# Patient Record
Sex: Female | Born: 1990 | Race: Black or African American | Hispanic: No | Marital: Single | Smoking: Current some day smoker
Health system: Southern US, Community
[De-identification: ages and names within clinical notes are randomized; demographics above are authoritative.]

## PROBLEM LIST (undated history)

## (undated) DIAGNOSIS — J45909 Unspecified asthma, uncomplicated: Secondary | ICD-10-CM

## (undated) HISTORY — DX: Unspecified asthma, uncomplicated: J45.909

---
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
## 2011-10-23 ENCOUNTER — Ambulatory Visit (INDEPENDENT_AMBULATORY_CARE_PROVIDER_SITE_OTHER): Payer: BLUE CROSS/BLUE SHIELD | Admitting: Family Medicine

## 2011-10-23 ENCOUNTER — Other Ambulatory Visit (HOSPITAL_COMMUNITY)
Admission: RE | Admit: 2011-10-23 | Discharge: 2011-10-23 | Disposition: A | Discharge status: Home / Self Care | Payer: BC Managed Care – PPO | Source: Ambulatory Visit | Attending: Family Medicine | Admitting: Family Medicine

## 2011-10-23 ENCOUNTER — Encounter: Payer: Self-pay | Admitting: Family Medicine

## 2011-10-23 VITALS — BP 128/84 | HR 73 | Ht 61.0 in | Wt 145.0 lb

## 2011-10-23 DIAGNOSIS — Z0000 Encounter for general adult medical examination without abnormal findings: Secondary | ICD-10-CM | POA: Insufficient documentation

## 2011-10-23 DIAGNOSIS — IMO0001 Reserved for inherently not codable concepts without codable children: Secondary | ICD-10-CM | POA: Insufficient documentation

## 2011-10-23 DIAGNOSIS — Z113 Encounter for screening for infections with a predominantly sexual mode of transmission: Secondary | ICD-10-CM | POA: Insufficient documentation

## 2011-10-23 DIAGNOSIS — L680 Hirsutism: Secondary | ICD-10-CM

## 2011-10-23 DIAGNOSIS — Z309 Encounter for contraceptive management, unspecified: Secondary | ICD-10-CM

## 2011-10-23 DIAGNOSIS — E663 Overweight: Secondary | ICD-10-CM

## 2011-10-23 LAB — RPR

## 2011-10-23 NOTE — Assessment & Plan Note (Addendum)
Requesting STD testing. HIV, RPR, GC/Chlamydia. History of negative tests. On Implanon Will refer to Rosalita Chessman for nutrition counseling. She is overweight but motivated to do better exercise/diet.

## 2011-10-23 NOTE — Progress Notes (Signed)
  Subjective:    Patient ID: Leah Townsend, female    DOB: Mar 17, 1991, 21 y.o.   MRN: [id number]  HPI She is here for well visit. 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  # Requesting STD testing She is sexually active with one partner past 2 months. Monogamous relationship. Sex with men only.  Birth control:    Implanon placed 09/2009   Uses condoms intermittently ROS: denies vaginal discharge, she has occasional spotting, denies irritation, denies fevers/chills/nausea  # Overweight She does not exercise much. She knows she has to but difficult doing She does not eat a balanced nutritious diet. Hard to motivated herself  Review of Systems Per HPI Denies back pain Denies dysuria    Objective:   Physical Exam GEN: NAD; well-nourished, -appearing SKIN: facial acne, hirsutism CV: RRR, no m/r/g PULM: NI WOB; CTAB without w/r/r ABD: soft, NT, ND, NABS EXT: no edema    Assessment & Plan:

## 2011-10-23 NOTE — Patient Instructions (Addendum)
If your lab results are normal, I will send you a letter with the results. If abnormal, someone at the clinic will get in touch with you.   Tetanus shot today.  You should be hearing from the Health Coach Arlys John). If you don't hear from her in 1 week, please call our clinic and let me know.   Follow-up in 1 year.

## 2011-10-23 NOTE — Addendum Note (Signed)
Addended by: Madolyn Frieze, Marylene Land J on: 10/23/2011 11:24 AM   Modules accepted: Orders

## 2011-10-27 ENCOUNTER — Encounter: Payer: Self-pay | Admitting: Family Medicine

## 2011-11-20 NOTE — Progress Notes (Signed)
Left message to call back if interested in working with a health coach.

## 2012-09-26 ENCOUNTER — Ambulatory Visit (INDEPENDENT_AMBULATORY_CARE_PROVIDER_SITE_OTHER): Payer: BC Managed Care – PPO | Admitting: Family Medicine

## 2012-09-26 ENCOUNTER — Other Ambulatory Visit (HOSPITAL_COMMUNITY)
Admission: RE | Admit: 2012-09-26 | Discharge: 2012-09-26 | Disposition: A | Discharge status: Home / Self Care | Payer: BC Managed Care – PPO | Source: Ambulatory Visit | Attending: Family Medicine | Admitting: Family Medicine

## 2012-09-26 ENCOUNTER — Encounter: Payer: Self-pay | Admitting: Family Medicine

## 2012-09-26 VITALS — BP 130/93 | HR 70 | Temp 98.2°F | Ht 61.0 in | Wt 136.0 lb

## 2012-09-26 DIAGNOSIS — Z30017 Encounter for initial prescription of implantable subdermal contraceptive: Secondary | ICD-10-CM

## 2012-09-26 DIAGNOSIS — Z113 Encounter for screening for infections with a predominantly sexual mode of transmission: Secondary | ICD-10-CM | POA: Insufficient documentation

## 2012-09-26 DIAGNOSIS — Z309 Encounter for contraceptive management, unspecified: Secondary | ICD-10-CM

## 2012-09-26 DIAGNOSIS — IMO0001 Reserved for inherently not codable concepts without codable children: Secondary | ICD-10-CM

## 2012-09-26 DIAGNOSIS — Z0000 Encounter for general adult medical examination without abnormal findings: Secondary | ICD-10-CM

## 2012-09-26 DIAGNOSIS — Z3046 Encounter for surveillance of implantable subdermal contraceptive: Secondary | ICD-10-CM

## 2012-09-26 MED ORDER — ETONOGESTREL 68 MG ~~LOC~~ IMPL
68.0000 mg | DRUG_IMPLANT | Freq: Once | SUBCUTANEOUS | Status: AC
  Administered 2012-09-26: 68 mg via SUBCUTANEOUS

## 2012-09-26 NOTE — Patient Instructions (Addendum)
Leave bandage on until later tonight. Ice the area as much as tolerated, giving breaks in between, to help with bruising and swelling.  Can take bandage off before bed, leave steri strips on until tomorrow morning, they will loosen and come off in shower.  We will call if STD testing results are abnormal and send a letter if they are normal.   Call [facility] for scheduling a nutrition appointment.

## 2012-09-26 NOTE — Progress Notes (Signed)
  Subjective:    Patient ID: Leah Townsend, female    DOB: 24-Jun-1990, 22 y.o.   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  #Implanon Expired 09/25/2012 Wishes to have it replaced Had concerns about birth control causing infertility later in life, reassured her that this is incorrect  #Nutrionist referral Has concerns about diet habits    Review of Systems negative except as above     Objective:   Physical Exam Filed Vitals:   09/26/12 1426  BP: 130/93  Pulse: 70  Temp: 98.2 F (36.8 C)   General appearance: alert and no distress Head: Normocephalic, without obvious abnormality, atraumatic Lungs: clear to auscultation bilaterally Heart: regular rate and rhythm and S1, S2 normal Abdomen: soft, non-tender; bowel sounds normal; no masses,  no organomegaly Pelvic: cervix normal in appearance, external genitalia normal, no adnexal masses or tenderness, no cervical motion tenderness, uterus normal size, shape, and consistency and vagina normal without discharge Skin: mild acne on face, hirsutim      Assessment & Plan:  See problem list

## 2012-09-26 NOTE — Assessment & Plan Note (Signed)
Nexplanon replaced in office today on 09/26/2012.

## 2012-09-26 NOTE — Assessment & Plan Note (Signed)
Requested STD testing today. HIV, RPR, GC/Chlamydia done. History of negative tests. Replaced nexplanon today. Requests nutritional counseling, referral put in and patient told to schedule.

## 2012-09-27 LAB — RPR

## 2012-09-27 LAB — HIV ANTIBODY (ROUTINE TESTING W REFLEX): HIV: NONREACTIVE

## 2013-01-26 ENCOUNTER — Other Ambulatory Visit: Payer: Self-pay

## 2014-01-03 ENCOUNTER — Ambulatory Visit: Payer: BC Managed Care – PPO | Admitting: Family Medicine

## 2014-01-18 ENCOUNTER — Encounter (HOSPITAL_COMMUNITY): Payer: Self-pay | Admitting: Emergency Medicine

## 2014-01-18 ENCOUNTER — Emergency Department (HOSPITAL_COMMUNITY)
Admission: EM | Admit: 2014-01-18 | Discharge: 2014-01-18 | Disposition: A | Discharge status: Home / Self Care | Payer: No Typology Code available for payment source | Attending: Emergency Medicine | Admitting: Emergency Medicine

## 2014-01-18 DIAGNOSIS — S3992XA Unspecified injury of lower back, initial encounter: Secondary | ICD-10-CM | POA: Insufficient documentation

## 2014-01-18 DIAGNOSIS — Z720 Tobacco use: Secondary | ICD-10-CM | POA: Insufficient documentation

## 2014-01-18 DIAGNOSIS — Y9389 Activity, other specified: Secondary | ICD-10-CM | POA: Insufficient documentation

## 2014-01-18 DIAGNOSIS — S199XXA Unspecified injury of neck, initial encounter: Secondary | ICD-10-CM | POA: Insufficient documentation

## 2014-01-18 DIAGNOSIS — Y92410 Unspecified street and highway as the place of occurrence of the external cause: Secondary | ICD-10-CM | POA: Diagnosis not present

## 2014-01-18 DIAGNOSIS — J45909 Unspecified asthma, uncomplicated: Secondary | ICD-10-CM | POA: Insufficient documentation

## 2014-01-18 MED ORDER — KETOROLAC TROMETHAMINE 60 MG/2ML IM SOLN
60.0000 mg | Freq: Once | INTRAMUSCULAR | Status: AC
  Administered 2014-01-18: 60 mg via INTRAMUSCULAR
  Filled 2014-01-18: qty 2

## 2014-01-18 MED ORDER — CYCLOBENZAPRINE HCL 10 MG PO TABS: 10.0000 mg | ORAL_TABLET | Freq: Two times a day (BID) | ORAL | Status: DC | PRN

## 2014-01-18 MED ORDER — IBUPROFEN 600 MG PO TABS: 600.0000 mg | ORAL_TABLET | Freq: Four times a day (QID) | ORAL | Status: DC | PRN

## 2014-01-18 MED ORDER — METHOCARBAMOL 500 MG PO TABS
500.0000 mg | ORAL_TABLET | ORAL | Status: AC
  Administered 2014-01-18: 500 mg via ORAL
  Filled 2014-01-18: qty 1

## 2014-01-18 NOTE — ED Notes (Signed)
Declined W/C at D/C and was escorted to lobby by RN. 
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
## 2014-01-18 NOTE — Discharge Instructions (Signed)
Please follow the directions provided. Be sure to follow-up with your primary care provider to ensure you're getting better. Take the medicine as directed for pain and muscle spasms. Don't hesitate to return for new, worsening, or concerning symptoms.  SEEK IMMEDIATE MEDICAL CARE IF:  You have pain that radiates from your back into your legs.  You develop new bowel or bladder control problems.  You have unusual weakness or numbness in your arms or legs.  You develop nausea or vomiting.  You develop abdominal pain.  You feel faint.
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
## 2014-01-30 ENCOUNTER — Ambulatory Visit: Payer: BC Managed Care – PPO | Admitting: Family Medicine

## 2014-05-25 ENCOUNTER — Ambulatory Visit: Payer: Self-pay | Admitting: Family Medicine

## 2014-08-02 ENCOUNTER — Ambulatory Visit: Payer: Self-pay

## 2014-08-08 ENCOUNTER — Other Ambulatory Visit (HOSPITAL_COMMUNITY)
Admission: RE | Admit: 2014-08-08 | Discharge: 2014-08-08 | Disposition: A | Discharge status: Home / Self Care | Payer: BLUE CROSS/BLUE SHIELD | Source: Ambulatory Visit | Attending: Family Medicine | Admitting: Family Medicine

## 2014-08-08 ENCOUNTER — Ambulatory Visit (INDEPENDENT_AMBULATORY_CARE_PROVIDER_SITE_OTHER): Payer: BLUE CROSS/BLUE SHIELD | Admitting: Family Medicine

## 2014-08-08 VITALS — BP 121/61 | HR 76 | Temp 97.8°F | Ht 62.0 in | Wt 126.5 lb

## 2014-08-08 DIAGNOSIS — Z0000 Encounter for general adult medical examination without abnormal findings: Secondary | ICD-10-CM

## 2014-08-08 DIAGNOSIS — Z113 Encounter for screening for infections with a predominantly sexual mode of transmission: Secondary | ICD-10-CM | POA: Diagnosis not present

## 2014-08-08 DIAGNOSIS — J069 Acute upper respiratory infection, unspecified: Secondary | ICD-10-CM | POA: Diagnosis not present

## 2014-08-08 DIAGNOSIS — Z202 Contact with and (suspected) exposure to infections with a predominantly sexual mode of transmission: Secondary | ICD-10-CM | POA: Diagnosis not present

## 2014-08-08 LAB — POCT URINE PREGNANCY: Preg Test, Ur: NEGATIVE

## 2014-08-08 NOTE — Patient Instructions (Signed)
Upper Respiratory Infection, Adult An upper respiratory infection (URI) is also sometimes known as the common cold. The upper respiratory tract includes the nose, sinuses, throat, trachea, and bronchi. Bronchi are the airways leading to the lungs. Most people improve within 1 week, but symptoms can last up to 2 weeks. A residual cough may last even longer.  CAUSES Many different viruses can infect the tissues lining the upper respiratory tract. The tissues become irritated and inflamed and often become very moist. Mucus production is also common. A cold is contagious. You can easily spread the virus to others by oral contact. This includes kissing, sharing a glass, coughing, or sneezing. Touching your mouth or nose and then touching a surface, which is then touched by another person, can also spread the virus. SYMPTOMS  Symptoms typically develop 1 to 3 days after you come in contact with a cold virus. Symptoms vary from person to person. They may include:  Runny nose.  Sneezing.  Nasal congestion.  Sinus irritation.  Sore throat.  Loss of voice (laryngitis).  Cough.  Fatigue.  Muscle aches.  Loss of appetite.  Headache.  Low-grade fever. DIAGNOSIS  You might diagnose your own cold based on familiar symptoms, since most people get a cold 2 to 3 times a year. Your caregiver can confirm this based on your exam. Most importantly, your caregiver can check that your symptoms are not due to another disease such as strep throat, sinusitis, pneumonia, asthma, or epiglottitis. Blood tests, throat tests, and X-rays are not necessary to diagnose a common cold, but they may sometimes be helpful in excluding other more serious diseases. Your caregiver will decide if any further tests are required. RISKS AND COMPLICATIONS  You may be at risk for a more severe case of the common cold if you smoke cigarettes, have chronic heart disease (such as heart failure) or lung disease (such as asthma), or if  you have a weakened immune system. The very young and very old are also at risk for more serious infections. Bacterial sinusitis, middle ear infections, and bacterial pneumonia can complicate the common cold. The common cold can worsen asthma and chronic obstructive pulmonary disease (COPD). Sometimes, these complications can require emergency medical care and may be life-threatening. PREVENTION  The best way to protect against getting a cold is to practice good hygiene. Avoid oral or hand contact with people with cold symptoms. Wash your hands often if contact occurs. There is no clear evidence that vitamin C, vitamin E, echinacea, or exercise reduces the chance of developing a cold. However, it is always recommended to get plenty of rest and practice good nutrition. TREATMENT  Treatment is directed at relieving symptoms. There is no cure. Antibiotics are not effective, because the infection is caused by a virus, not by bacteria. Treatment may include:  Increased fluid intake. Sports drinks offer valuable electrolytes, sugars, and fluids.  Breathing heated mist or steam (vaporizer or shower).  Eating chicken soup or other clear broths, and maintaining good nutrition.  Getting plenty of rest.  Using gargles or lozenges for comfort.  Controlling fevers with ibuprofen or acetaminophen as directed by your caregiver.  Increasing usage of your inhaler if you have asthma. Zinc gel and zinc lozenges, taken in the first 24 hours of the common cold, can shorten the duration and lessen the severity of symptoms. Pain medicines may help with fever, muscle aches, and throat pain. A variety of non-prescription medicines are available to treat congestion and runny nose. Your caregiver   can make recommendations and may suggest nasal or lung inhalers for other symptoms.  HOME CARE INSTRUCTIONS   Only take over-the-counter or prescription medicines for pain, discomfort, or fever as directed by your  caregiver.  Use a warm mist humidifier or inhale steam from a shower to increase air moisture. This may keep secretions moist and make it easier to breathe.  Drink enough water and fluids to keep your urine clear or pale yellow.  Rest as needed.  Return to work when your temperature has returned to normal or as your caregiver advises. You may need to stay home longer to avoid infecting others. You can also use a face mask and careful hand washing to prevent spread of the virus. SEEK MEDICAL CARE IF:   After the first few days, you feel you are getting worse rather than better.  You need your caregiver's advice about medicines to control symptoms.  You develop chills, worsening shortness of breath, or brown or red sputum. These may be signs of pneumonia.  You develop yellow or brown nasal discharge or pain in the face, especially when you bend forward. These may be signs of sinusitis.  You develop a fever, swollen neck glands, pain with swallowing, or white areas in the back of your throat. These may be signs of strep throat. SEEK IMMEDIATE MEDICAL CARE IF:   You have a fever.  You develop severe or persistent headache, ear pain, sinus pain, or chest pain.  You develop wheezing, a prolonged cough, cough up blood, or have a change in your usual mucus (if you have chronic lung disease).  You develop sore muscles or a stiff neck. Document Released: 09/02/2000 Document Revised: 06/01/2011 Document Reviewed: 06/14/2013 Exit[employer] Patient Information 2015 [employer]. This information is not intended to replace advice given to you by your health care provider. Make sure you discuss any questions you have with your health care provider.  

## 2014-08-09 LAB — RPR

## 2014-08-09 LAB — HIV ANTIBODY (ROUTINE TESTING W REFLEX): HIV 1&2 Ab, 4th Generation: NONREACTIVE

## 2014-08-10 LAB — URINE CYTOLOGY ANCILLARY ONLY
Chlamydia: NEGATIVE
NEISSERIA GONORRHEA: NEGATIVE

## 2014-08-11 DIAGNOSIS — J069 Acute upper respiratory infection, unspecified: Secondary | ICD-10-CM | POA: Insufficient documentation

## 2014-08-11 NOTE — Progress Notes (Signed)
Subjective:     Patient ID: Leah SpiesAyana J Townsend, female   DOB: 1991-03-19, 24 y.o.   MRN: [id number]  HPI Mrs. Leah Townsend is a 24yo presenting for upper respiratory tract infection and would also like STD screening.  #URI: - First noted symptoms on Saturday 5/14. States she caught it from her boss who was out of work for a few days. - Complains of runny and stuffy nose, congestion - Symptoms worse when she gets up in the morning - Denies cough, body aches, fevers, facial pain - Has not taken any medications at this time - Reports trouble working due to above symptoms. Requests note for today and tomorrow out of work to return to work 5/20  # STD Screening - Denies history of previous STD - Last sexually active in March. Did not use protection. - Implanon in place - Denies discharge, irritation, bleeding, dysuria, genital lesions  Review of Systems  Constitutional: Negative for fever.  HENT: Positive for congestion, postnasal drip and rhinorrhea. Negative for sinus pressure.   Genitourinary: Negative for dysuria, urgency, frequency, hematuria, vaginal discharge and vaginal pain.       Objective:   Physical Exam  Constitutional: She is oriented to person, place, and time. She appears well-developed and well-nourished.  HENT:  Head: Normocephalic and atraumatic.  Right Ear: External ear normal.  Left Ear: External ear normal.  Post nasal drip noted  Eyes: Conjunctivae are normal. Pupils are equal, round, and reactive to light. Right eye exhibits no discharge.  Cardiovascular: Normal rate and regular rhythm.  Exam reveals no gallop and no friction rub.   No murmur heard. Pulmonary/Chest: Effort normal. No respiratory distress. She has no wheezes. She has no rales.  Abdominal: Soft. She exhibits no distension. There is no tenderness.  Musculoskeletal: She exhibits no edema.  Lymphadenopathy:    She has no cervical adenopathy.  Neurological: She is alert and oriented to person, place, and  time.  Skin: Skin is warm and dry. No rash noted.  Psychiatric: She has a normal mood and affect. Her behavior is normal.      Assessment:     Please refer to Problem List for Assessment.     Plan:     Please refer to Problem List for Plan.

## 2014-08-11 NOTE — Assessment & Plan Note (Signed)
-   STD Screening: HIV nonreactive, RPR nonreactive, GC/Chlamydia negative, Pregnancy negative

## 2014-08-11 NOTE — Assessment & Plan Note (Signed)
-   Will not prescribe antibiotics at this time - Suspect this will improve on it's own - Patient declines other medications at this time - Note written to return to work 5/20

## 2014-08-21 ENCOUNTER — Telehealth: Payer: Self-pay | Admitting: Family Medicine

## 2014-08-21 NOTE — Telephone Encounter (Signed)
Phone number listed not functional. Attempted to contact x3 concerning normal lab results. Requested labs be given via phone instead of via mail. Discuss lab results at next office visit or if she calls requesting results.

## 2014-08-23 ENCOUNTER — Ambulatory Visit (INDEPENDENT_AMBULATORY_CARE_PROVIDER_SITE_OTHER): Payer: BLUE CROSS/BLUE SHIELD | Admitting: Family Medicine

## 2014-08-23 VITALS — BP 120/72 | HR 75 | Temp 97.8°F | Ht 62.0 in | Wt 124.9 lb

## 2014-08-23 DIAGNOSIS — Z3009 Encounter for other general counseling and advice on contraception: Secondary | ICD-10-CM | POA: Diagnosis not present

## 2014-08-23 NOTE — Patient Instructions (Signed)
It was nice seeing you today, we got your Nexplanon out today. Now that your birth control is out, you can become pregnant if sexually active, so please use condom for protection if sexually active.

## 2014-08-23 NOTE — Progress Notes (Signed)
Patient ID: Leah Townsend, female   DOB: [birth year], 24 y.o.   MRN: [id number] Nexplanon Removal Procedure Note  PRE-OP DIAGNOSIS:Desire for  Nexplanon removal. She is not interested in birth control for now.  POST-OP DIAGNOSIS: Same   PROCEDURE: Nexplanon Removal  Performing Physician:  Janit PaganKehinde Janylah Belgrave, MD, MPH  PROCEDURE:  Anesthesia: 2% Lidocaine w/ epinephrine 5 ml  On her right arm.  Nexplanon device palpated prior to starting procedure on her right arm.  Procedure: Consent obtained. A time-out was performed prior to initiating procedure to be sure of right patient and right location. The area surrounding the Nexplanon was prepared with Choloraprep and draped in the usual sterile manner. The site was anesthetized with lidocaine. A perpendicular skin incision was made over the distal aspect of the device. The capsule lysed sharply and the device removed using a hemostat. Hemostasis was assured. The site was dressed witha pressure dressing. The patient tolerated the procedure well.   Followup: The patient tolerated the procedure well without complications. Standard post-procedure care is explained and return precautions are given.Advised to use condom to prevent pregnancy since she is not currently interested in starting another birth control.

## 2015-01-28 ENCOUNTER — Encounter: Payer: Self-pay | Admitting: Family Medicine

## 2015-01-28 ENCOUNTER — Ambulatory Visit (INDEPENDENT_AMBULATORY_CARE_PROVIDER_SITE_OTHER): Payer: BLUE CROSS/BLUE SHIELD | Admitting: Family Medicine

## 2015-01-28 VITALS — BP 116/68 | HR 84 | Temp 98.8°F | Wt 120.0 lb

## 2015-01-28 DIAGNOSIS — J069 Acute upper respiratory infection, unspecified: Secondary | ICD-10-CM | POA: Diagnosis not present

## 2015-01-28 NOTE — Patient Instructions (Signed)
Upper Respiratory Infection, Adult Most upper respiratory infections (URIs) are a viral infection of the air passages leading to the lungs. A URI affects the nose, throat, and upper air passages. The most common type of URI is nasopharyngitis and is typically referred to as "the common cold." URIs run their course and usually go away on their own. Most of the time, a URI does not require medical attention, but sometimes a bacterial infection in the upper airways can follow a viral infection. This is called a secondary infection. Sinus and middle ear infections are common types of secondary upper respiratory infections. Bacterial pneumonia can also complicate a URI. A URI can worsen asthma and chronic obstructive pulmonary disease (COPD). Sometimes, these complications can require emergency medical care and may be life threatening.  CAUSES Almost all URIs are caused by viruses. A virus is a type of germ and can spread from one person to another.  RISKS FACTORS You may be at risk for a URI if:   You smoke.   You have chronic heart or lung disease.  You have a weakened defense (immune) system.   You are very young or very old.   You have nasal allergies or asthma.  You work in crowded or poorly ventilated areas.  You work in health care facilities or schools. SIGNS AND SYMPTOMS  Symptoms typically develop 2-3 days after you come in contact with a cold virus. Most viral URIs last 7-10 days. However, viral URIs from the influenza virus (flu virus) can last 14-18 days and are typically more severe. Symptoms may include:   Runny or stuffy (congested) nose.   Sneezing.   Cough.   Sore throat.   Headache.   Fatigue.   Fever.   Loss of appetite.   Pain in your forehead, behind your eyes, and over your cheekbones (sinus pain).  Muscle aches.  DIAGNOSIS  Your health care provider may diagnose a URI by:  Physical exam.  Tests to check that your symptoms are not due to  another condition such as:  Strep throat.  Sinusitis.  Pneumonia.  Asthma. TREATMENT  A URI goes away on its own with time. It cannot be cured with medicines, but medicines may be prescribed or recommended to relieve symptoms. Medicines may help:  Reduce your fever.  Reduce your cough.  Relieve nasal congestion. HOME CARE INSTRUCTIONS   Take medicines only as directed by your health care provider.   Gargle warm saltwater or take cough drops to comfort your throat as directed by your health care provider.  Use a warm mist humidifier or inhale steam from a shower to increase air moisture. This may make it easier to breathe.  Drink enough fluid to keep your urine clear or pale yellow.   Eat soups and other clear broths and maintain good nutrition.   Rest as needed.   Return to work when your temperature has returned to normal or as your health care provider advises. You may need to stay home longer to avoid infecting others. You can also use a face mask and careful hand washing to prevent spread of the virus.  Increase the usage of your inhaler if you have asthma.   Do not use any tobacco products, including cigarettes, chewing tobacco, or electronic cigarettes. If you need help quitting, ask your health care provider. PREVENTION  The best way to protect yourself from getting a cold is to practice good hygiene.   Avoid oral or hand contact with people with cold   symptoms.   Wash your hands often if contact occurs.  There is no clear evidence that vitamin C, vitamin E, echinacea, or exercise reduces the chance of developing a cold. However, it is always recommended to get plenty of rest, exercise, and practice good nutrition.  SEEK MEDICAL CARE IF:   You are getting worse rather than better.   Your symptoms are not controlled by medicine.   You have chills.  You have worsening shortness of breath.  You have brown or red mucus.  You have yellow or brown nasal  discharge.  You have pain in your face, especially when you bend forward.  You have a fever.  You have swollen neck glands.  You have pain while swallowing.  You have white areas in the back of your throat. SEEK IMMEDIATE MEDICAL CARE IF:   You have severe or persistent:  Headache.  Ear pain.  Sinus pain.  Chest pain.  You have chronic lung disease and any of the following:  Wheezing.  Prolonged cough.  Coughing up blood.  A change in your usual mucus.  You have a stiff neck.  You have changes in your:  Vision.  Hearing.  Thinking.  Mood. MAKE SURE YOU:   Understand these instructions.  Will watch your condition.  Will get help right away if you are not doing well or get worse.   This information is not intended to replace advice given to you by your health care provider. Make sure you discuss any questions you have with your health care provider.   Document Released: 09/02/2000 Document Revised: 07/24/2014 Document Reviewed: 06/14/2013 Elsevier Interactive Patient Education 2016 [employer]  

## 2015-01-28 NOTE — Progress Notes (Signed)
    Subjective: CC: URI, nausea HPI: Patient is a 24 y.o. female presenting to clinic today for same day appt. Concerns today include:  Patient reports that symptoms started last Thursday.  She reports that symptoms are mostly present in am.  She reports some fatigue, runny nose, sore throat.  She has sick contacts (dad, cousin).  One episode of nonbloody diarrhea yesterday.  Patient is eating and drinking normally.  Denies cough, sneezing, itchy eyes, vomiting, fevers.  She has taken Motrin/Tylenol with little relief.  LMP 01/11/15.  Social History Reviewed: non tobacco smoker. occ MJ use FamHx and MedHx updated.  Please see EMR. Health Maintenance: no flu shot, declines  ROS: Per HPI  Objective: Office vital signs reviewed. BP 116/68 mmHg  Pulse 84  Temp(Src) 98.8 F (37.1 C) (Oral)  Wt 120 lb (54.432 kg)  Physical Examination:  General: Awake, alert, well nourished, No acute distress HEENT: Normal    Neck: No masses palpated. No lymphadenopathy    Ears: Tympanic membranes intact, normal light reflex, no erythema, no bulging, moderate cerumen in canals bilaterally    Eyes: PERRLA, EOMI    Nose: nasal turbinates moist    Throat: moist mucus membranes, no erythema, no tonsillar exudate Cardio: regular rate and rhythm, S1S2 heard, no murmurs appreciated Pulm: clear to auscultation bilaterally, no wheezes, rhonchi or rales, normal work of breathing Extremities: warm, well perfused, No edema, cyanosis or clubbing; +2 radial pulses bilaterally Skin: dry, intact, no rashes or lesions  Assessment/ Plan: 24 y.o. female   1. Acute upper respiratory infection, likely viral.  Afebrile at today's visit.  No evidence of secondary bacterial infection, including strep throat or sinus infection.  Does not fit with influenza.  Additionally, no wheeze one exam.  No antibiotics needed today. - Recommended supportive care - Motrin, Tylenol as needed pain/headache or fevers - Work note  provided - Return precautions reviewed - Follow up with Dr Waynetta SandyWight as needed routine care  Raliegh IpAshly M Gottschalk, DO PGY-2, [facility]

## 2019-09-06 ENCOUNTER — Emergency Department (HOSPITAL_COMMUNITY)
Admission: EM | Admit: 2019-09-06 | Discharge: 2019-09-06 | Disposition: A | Discharge status: Home / Self Care | Payer: BLUE CROSS/BLUE SHIELD | Attending: Emergency Medicine | Admitting: Emergency Medicine

## 2019-09-06 ENCOUNTER — Other Ambulatory Visit: Payer: Self-pay

## 2019-09-06 ENCOUNTER — Encounter (HOSPITAL_COMMUNITY): Payer: Self-pay | Admitting: *Deleted

## 2019-09-06 DIAGNOSIS — J45909 Unspecified asthma, uncomplicated: Secondary | ICD-10-CM | POA: Insufficient documentation

## 2019-09-06 DIAGNOSIS — F17210 Nicotine dependence, cigarettes, uncomplicated: Secondary | ICD-10-CM | POA: Insufficient documentation

## 2019-09-06 DIAGNOSIS — Y9389 Activity, other specified: Secondary | ICD-10-CM | POA: Insufficient documentation

## 2019-09-06 DIAGNOSIS — Y92410 Unspecified street and highway as the place of occurrence of the external cause: Secondary | ICD-10-CM | POA: Insufficient documentation

## 2019-09-06 DIAGNOSIS — Y999 Unspecified external cause status: Secondary | ICD-10-CM | POA: Insufficient documentation

## 2019-09-06 DIAGNOSIS — S161XXA Strain of muscle, fascia and tendon at neck level, initial encounter: Secondary | ICD-10-CM | POA: Insufficient documentation

## 2019-09-06 DIAGNOSIS — Z79899 Other long term (current) drug therapy: Secondary | ICD-10-CM | POA: Insufficient documentation

## 2019-09-06 MED ORDER — IBUPROFEN 600 MG PO TABS: 600.0000 mg | ORAL_TABLET | Freq: Four times a day (QID) | ORAL | 0 refills | Status: DC | PRN

## 2019-09-06 MED ORDER — IBUPROFEN 800 MG PO TABS
800.0000 mg | ORAL_TABLET | Freq: Once | ORAL | Status: AC
  Administered 2019-09-06: 800 mg via ORAL
  Filled 2019-09-06: qty 1

## 2019-09-06 MED ORDER — LIDOCAINE 5 % EX PTCH
1.0000 | MEDICATED_PATCH | CUTANEOUS | Status: DC
  Administered 2019-09-06: 1 via TRANSDERMAL
  Filled 2019-09-06: qty 1

## 2019-09-06 MED ORDER — CYCLOBENZAPRINE HCL 10 MG PO TABS: 10.0000 mg | ORAL_TABLET | Freq: Two times a day (BID) | ORAL | 0 refills | Status: DC | PRN

## 2019-09-06 NOTE — ED Notes (Signed)
Patient verbalizes understanding of discharge instructions. Opportunity for questioning and answers were provided. Armband removed by staff, pt discharged from ED.  

## 2019-09-06 NOTE — Discharge Instructions (Addendum)
Home to rest, apply warm compresses to sore muscles for 20 days at a time followed by gentle stretching. You can take Motrin as prescribed as needed, take Flexeril as needed as prescribed.  Do not take Flexeril if driving until you know how this medication affects you.  Flexeril can cause constipation, take Colace if needed. Follow-up with your primary care provider if pain is not improving after 3 to 4 days.
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
## 2020-01-10 ENCOUNTER — Other Ambulatory Visit: Payer: Self-pay

## 2020-01-10 ENCOUNTER — Emergency Department (HOSPITAL_COMMUNITY)
Admission: EM | Admit: 2020-01-10 | Discharge: 2020-01-10 | Disposition: A | Discharge status: Home / Self Care | Payer: Self-pay | Attending: Emergency Medicine | Admitting: Emergency Medicine

## 2020-01-10 ENCOUNTER — Encounter (HOSPITAL_COMMUNITY): Payer: Self-pay | Admitting: Emergency Medicine

## 2020-01-10 DIAGNOSIS — S39012A Strain of muscle, fascia and tendon of lower back, initial encounter: Secondary | ICD-10-CM | POA: Insufficient documentation

## 2020-01-10 DIAGNOSIS — F17200 Nicotine dependence, unspecified, uncomplicated: Secondary | ICD-10-CM | POA: Insufficient documentation

## 2020-01-10 DIAGNOSIS — W07XXXA Fall from chair, initial encounter: Secondary | ICD-10-CM | POA: Insufficient documentation

## 2020-01-10 DIAGNOSIS — J45909 Unspecified asthma, uncomplicated: Secondary | ICD-10-CM | POA: Insufficient documentation

## 2020-01-10 MED ORDER — IBUPROFEN 600 MG PO TABS: 600.0000 mg | ORAL_TABLET | Freq: Four times a day (QID) | ORAL | 0 refills | Status: DC | PRN

## 2020-01-10 MED ORDER — PREDNISONE 10 MG (21) PO TBPK: ORAL_TABLET | ORAL | 0 refills | Status: DC

## 2020-01-10 MED ORDER — CYCLOBENZAPRINE HCL 10 MG PO TABS: 10.0000 mg | ORAL_TABLET | Freq: Two times a day (BID) | ORAL | 0 refills | Status: DC | PRN

## 2020-01-10 NOTE — ED Provider Notes (Signed)
[facility] EMERGENCY DEPARTMENT Provider Note   CSN: [id number] Arrival date & time: 01/10/20  0930     History Chief Complaint  Patient presents with  . Back Pain    Leah Townsend is a 29 y.o. female.  Pt presents to the ED today with left upper back pain.  Pt said it's been hurting for about 2-3 days.  She has been sitting in a chair that has 1 leg broken, so she's been twisting herself to be upright.  She believes this is the cause.  She has been taking ibuprofen without improvement in sx.          Past Medical History:  Diagnosis Date  . ASTHMA, UNSPECIFIED 05/20/2006   Qualifier: Diagnosis of  By: Bradly Bienenstock      Patient Active Problem List   Diagnosis Date Noted  . Acute upper respiratory infection 08/11/2014  . Preventative health care 10/23/2011  . Birth control 10/23/2011  . Hirsutism 09/04/2008    History reviewed. No pertinent surgical history.   OB History   No obstetric history on file.     No family history on file.  Social History   Tobacco Use  . Smoking status: Current Some Day Smoker  . Smokeless tobacco: Never Used  Substance Use Topics  . Alcohol use: Yes    Comment: occas  . Drug use: Yes    Types: Marijuana    Home Medications Prior to Admission medications   Medication Sig Start Date End Date Taking? Authorizing Provider  cyclobenzaprine (FLEXERIL) 10 MG tablet Take 1 tablet (10 mg total) by mouth 2 (two) times daily as needed for muscle spasms. 01/10/20   Jacalyn Lefevre, MD  ibuprofen (ADVIL) 600 MG tablet Take 1 tablet (600 mg total) by mouth every 6 (six) hours as needed. 01/10/20   Jacalyn Lefevre, MD  predniSONE (STERAPRED UNI-PAK 21 TAB) 10 MG (21) TBPK tablet Take 6 tabs for 2 days, then 5 for 2 days, then 4 for 2 days, then 3 for 2 days, 2 for 2 days, then 1 for 2 days 01/10/20   Jacalyn Lefevre, MD    Allergies    Patient has no known allergies.  Review of Systems   Review of Systems    Musculoskeletal: Positive for back pain.  All other systems reviewed and are negative.   Physical Exam Updated Vital Signs BP 124/71 (BP Location: Right Arm)   Pulse 67   Temp 98.5 F (36.9 C) (Oral)   Resp 16   Ht 5\' 1"  (1.549 m)   Wt 55.3 kg   LMP 01/03/2020   SpO2 100%   BMI 23.05 kg/m   Physical Exam Vitals and nursing note reviewed.  Constitutional:      Appearance: Normal appearance.  HENT:     Head: Normocephalic and atraumatic.     Right Ear: External ear normal.     Left Ear: External ear normal.     Nose: Nose normal.     Mouth/Throat:     Mouth: Mucous membranes are moist.     Pharynx: Oropharynx is clear.  Eyes:     Extraocular Movements: Extraocular movements intact.     Conjunctiva/sclera: Conjunctivae normal.     Pupils: Pupils are equal, round, and reactive to light.  Cardiovascular:     Rate and Rhythm: Normal rate and regular rhythm.     Pulses: Normal pulses.     Heart sounds: Normal heart sounds.  Pulmonary:  Effort: Pulmonary effort is normal.     Breath sounds: Normal breath sounds.  Abdominal:     General: Abdomen is flat. Bowel sounds are normal.     Palpations: Abdomen is soft.  Musculoskeletal:     Cervical back: Normal range of motion and neck supple.       Back:  Skin:    General: Skin is warm.     Capillary Refill: Capillary refill takes less than 2 seconds.  Neurological:     General: No focal deficit present.     Mental Status: She is alert.  Psychiatric:        Mood and Affect: Mood normal.        Thought Content: Thought content normal.        Judgment: Judgment normal.     ED Results / Procedures / Treatments   Labs (all labs ordered are listed, but only abnormal results are displayed) Labs Reviewed - No data to display  EKG None  Radiology No results found.  Procedures Procedures (including critical care time)  Medications Ordered in ED Medications - No data to display  ED Course  I have reviewed  the triage vital signs and the nursing notes.  Pertinent labs & imaging results that were available during my care of the patient were reviewed by me and considered in my medical decision making (see chart for details).    MDM Rules/Calculators/A&P                          Pain is muscular.  Pt encouraged to get a new, more ergonomic chair.  She is to return if worse.  Final Clinical Impression(s) / ED Diagnoses Final diagnoses:  Back strain, initial encounter    Rx / DC Orders ED Discharge Orders         Ordered    predniSONE (STERAPRED UNI-PAK 21 TAB) 10 MG (21) TBPK tablet        01/10/20 1006    cyclobenzaprine (FLEXERIL) 10 MG tablet  2 times daily PRN        01/10/20 1006    ibuprofen (ADVIL) 600 MG tablet  Every 6 hours PRN        01/10/20 1006           Jacalyn Lefevre, MD 01/10/20 1008

## 2020-01-10 NOTE — ED Triage Notes (Signed)
C/o L upper back pain x 2-3 days.  Pt states she believes it is from sitting in a broken chair.  Pain improves when she leans forward.

## 2021-08-06 ENCOUNTER — Emergency Department (HOSPITAL_COMMUNITY)
Admission: EM | Admit: 2021-08-06 | Discharge: 2021-08-06 | Disposition: A | Discharge status: Home / Self Care | Payer: Self-pay | Attending: Emergency Medicine | Admitting: Emergency Medicine

## 2021-08-06 DIAGNOSIS — J029 Acute pharyngitis, unspecified: Secondary | ICD-10-CM | POA: Insufficient documentation

## 2021-08-06 DIAGNOSIS — Z20822 Contact with and (suspected) exposure to covid-19: Secondary | ICD-10-CM | POA: Insufficient documentation

## 2021-08-06 DIAGNOSIS — M542 Cervicalgia: Secondary | ICD-10-CM | POA: Insufficient documentation

## 2021-08-06 LAB — GROUP A STREP BY PCR: Group A Strep by PCR: NOT DETECTED

## 2021-08-06 LAB — RESP PANEL BY RT-PCR (FLU A&B, COVID) ARPGX2
Influenza A by PCR: NEGATIVE
Influenza B by PCR: NEGATIVE
SARS Coronavirus 2 by RT PCR: NEGATIVE

## 2021-08-06 NOTE — ED Provider Notes (Signed)
[facility]-EMERGENCY DEPT Provider Note   CSN: [id number] Arrival date & time: 08/06/21  [id number]     History Chief Complaint  Patient presents with   Sore Throat    Leah Townsend is a 31 y.o. female otherwise healthy presents to the ED for evaluation of scratchy throat. She said she reports she had a tickle in her throat that required her to clear her throat. She had some neck pain that was relieved with the Aleve she took earlier. She reports that these pains happened today.  She denies any fever, chills, cough, rhinorrhea, nasal chest, chest pain, shortness of breath, trouble swallowing, trouble breathing. Denies any headache, blurry vision, or any photophobia. Denies any pain on ROM of her neck. She has been eating and drink without difficulty.  Denies any medical history other than hirsutism.  Denies any surgical history.  Negative medications.  No known drug allergies.  Up-to-date on all of her vaccinations as well as child.  She does smoke marijuana daily.  Denies any tobacco or EtOH use.   Sore Throat Pertinent negatives include no chest pain, no abdominal pain, no headaches and no shortness of breath.      Home Medications Prior to Admission medications   Medication Sig Start Date End Date Taking? Authorizing Provider  cyclobenzaprine (FLEXERIL) 10 MG tablet Take 1 tablet (10 mg total) by mouth 2 (two) times daily as needed for muscle spasms. 01/10/20   Jacalyn LefevreHaviland, Julie, MD  ibuprofen (ADVIL) 600 MG tablet Take 1 tablet (600 mg total) by mouth every 6 (six) hours as needed. 01/10/20   Jacalyn LefevreHaviland, Julie, MD  predniSONE (STERAPRED UNI-PAK 21 TAB) 10 MG (21) TBPK tablet Take 6 tabs for 2 days, then 5 for 2 days, then 4 for 2 days, then 3 for 2 days, 2 for 2 days, then 1 for 2 days 01/10/20   Jacalyn LefevreHaviland, Julie, MD      Allergies    Patient has no known allergies.    Review of Systems   Review of Systems  Constitutional:  Negative for chills and fever.  HENT:   Positive for sore throat. Negative for congestion and rhinorrhea.   Eyes:  Negative for photophobia and visual disturbance.  Respiratory:  Negative for shortness of breath.   Cardiovascular:  Negative for chest pain.  Gastrointestinal:  Negative for abdominal pain, constipation, diarrhea, nausea and vomiting.  Neurological:  Negative for headaches.   Physical Exam Updated Vital Signs BP 122/89 (BP Location: Left Arm)   Pulse 87   Temp 98.7 F (37.1 C) (Oral)   Resp 16   Ht 5\' 1"  (1.549 m)   Wt 59 kg   LMP 07/17/2021   SpO2 100%   BMI 24.56 kg/m  Physical Exam Vitals and nursing note reviewed.  Constitutional:      General: She is not in acute distress.    Appearance: She is not ill-appearing or toxic-appearing.     Comments: The patient is well-appearing, laughing, speaking with ease.   HENT:     Head: Normocephalic and atraumatic.     Right Ear: Tympanic membrane, ear canal and external ear normal.     Left Ear: Tympanic membrane, ear canal and external ear normal.     Nose: No congestion or rhinorrhea.     Mouth/Throat:     Mouth: Mucous membranes are moist.     Pharynx: Oropharynx is clear. Uvula midline.     Comments: No pharyngeal erythema, exudate, or edema noted.  Uvula midline.  Airway patent.  Moist mucous membranes. Eyes:     General: No scleral icterus.    Conjunctiva/sclera: Conjunctivae normal.  Neck:     Thyroid: No thyromegaly.     Comments: Full ROM of neck without pain. No cervical, deep or anterior, lymphadenopathy. No masses. No overlying skin changes, other than hirsutism noted to the neck. No midline tenderness. No thyroid nodules or masses palpated on the patient swallowing. No Goiter. No increase in temperature. Neck appears symmetric. Cardiovascular:     Rate and Rhythm: Normal rate.     Heart sounds: Normal heart sounds.  Pulmonary:     Effort: Pulmonary effort is normal. No respiratory distress.     Breath sounds: Normal breath sounds. No  stridor.  Abdominal:     General: Abdomen is flat.     Palpations: Abdomen is soft.  Musculoskeletal:        General: No deformity.     Cervical back: Normal range of motion.  Lymphadenopathy:     Cervical: No cervical adenopathy.  Skin:    Findings: No rash.  Neurological:     General: No focal deficit present.     Mental Status: She is alert.    ED Results / Procedures / Treatments   Labs (all labs ordered are listed, but only abnormal results are displayed) Labs Reviewed  GROUP A STREP BY PCR  RESP PANEL BY RT-PCR (FLU A&B, COVID) [id number]    EKG None  Radiology No results found.  Procedures Procedures   Medications Ordered in ED Medications - No data to display  ED Course/ Medical Decision Making/ A&P                           Medical Decision Making   31 year old female presents the emergency department for evaluation of scratchy throat as well as some neck pain that has not been relieved.  Differential diagnosis includes was not limited to COVID, flu, strep throat, viral illness, pharyngitis, lymphadenopathy, meningitis.  Vital signs are unremarkable.  Patient normotensive, afebrile, normal pulse rate, satting well on room air without any increased work of breathing.  Physical exam is pertinent for well-appearing patient that is laughing and speaking with ease.  TMs are normal.  She has no pharyngeal erythema, edema, or exudate noted.  I do not palpate any cervical lymphadenopathy both anterior or deep.  She has full range of motion of her neck without pain.  No midline tenderness.  I do not palpate any thyroid masses or nodules.  She has no goiter.  No overlying skin changes, erythema, or extra warmth to the area.  We will test for COVID and flu and strep.  COVID, flu, and strep negative.  The patient is well-appearing, she does not have any shortness of breath or trouble swallowing or breathing.  She has a benign physical exam.  I do not suspect any PTA as she  has well-appearing nonerythematous tonsils bilaterally.  Her COVID, flu, and strep are negative.  She does not have any lymphadenopathy.  She has full range of motion of her neck with reassuring vital signs and without a fever, doubt meningitis.  I discussed these results with the patient.  We discussed supportive care measures such as drinking warm liquids with honey and lemon, staying well-hydrated.  Recommended that she can try cough drops to keep her throat well lubricated.  We discussed strict return precautions and red flag symptoms.  The patient  is requesting a note off of work for 2 days.  She verbalizes understanding and agrees to the plan.  Patient is stable being discharged home in good condition.    Final Clinical Impression(s) / ED Diagnoses Final diagnoses:  Sore throat    Rx / DC Orders ED Discharge Orders     None         Achille Rich, PA-C 08/06/21 1238    Bethann Berkshire, MD 08/11/21 [phone]

## 2021-08-06 NOTE — Discharge Instructions (Addendum)
You were seen in the emergency department for evaluation of your scratchy throat.  Your COVID, flu, and strep test were negative.  Please make sure you are keeping your throat well lubricated with fluids, mainly water.  You can also try warm tea with honey and lemon.  You can also try cough drops as well. If you have any trouble swallowing, trouble breathing, fever, neck swelling, chest pain, shortness of breath, please return nearest emergency department for reevaluation.  I have included the information for a primary care office to this discharge paperwork.  Please call to set up an appointment to become established with them. ? ?Contact a doctor if: ?You have a fever for more than 2-3 days. ?You keep having symptoms for more than 2-3 days. ?Your throat does not get better in 7 days. ?You have a fever and your symptoms suddenly get worse. ?Your child who is 3 months to 76 years old has a temperature of 102.2?F (39?C) or higher. ?Get help right away if: ?You have trouble breathing. ?You cannot swallow fluids, soft foods, or your spit. ?You have swelling in your throat or neck that gets worse. ?You feel like you may vomit (nauseous) and this feeling lasts a long time. ?You cannot stop vomiting. ?These symptoms may be an emergency. Get help right away. Call your local emergency services (911 in the [place] ?Do not wait to see if the symptoms will go away. ?Do not drive yourself to the hospital. ?

## 2021-08-06 NOTE — ED Triage Notes (Signed)
Pt states she is having right sided throat pain and swelling. States cold water makes this pain better. ?

## 2022-12-16 ENCOUNTER — Encounter: Payer: Self-pay | Admitting: Family Medicine

## 2022-12-16 ENCOUNTER — Ambulatory Visit (INDEPENDENT_AMBULATORY_CARE_PROVIDER_SITE_OTHER): Payer: Self-pay | Admitting: Family Medicine

## 2022-12-16 VITALS — BP 140/95 | HR 78 | Temp 98.1°F | Resp 16 | Ht 62.0 in | Wt 136.2 lb

## 2022-12-16 DIAGNOSIS — U071 COVID-19: Secondary | ICD-10-CM

## 2022-12-16 DIAGNOSIS — Z7689 Persons encountering health services in other specified circumstances: Secondary | ICD-10-CM

## 2022-12-16 NOTE — Addendum Note (Signed)
Addended by: Kieth Brightly on: 12/16/2022 04:40 PM   Modules accepted: Orders

## 2022-12-16 NOTE — Progress Notes (Signed)
New Patient Office Visit  Subjective    Patient ID: Leah Townsend, female    DOB: [birth year]  Age: 32 y.o. MRN: [id number]  CC:  Chief Complaint  Patient presents with   Nasal Congestion    HPI Leah Townsend presents to establish care and with complaint of positive covid test 2 days ago at home. She is unable to return to work. Denies known contacts.    Outpatient Encounter Medications as of 12/16/2022  Medication Sig   cyclobenzaprine (FLEXERIL) 10 MG tablet Take 1 tablet (10 mg total) by mouth 2 (two) times daily as needed for muscle spasms.   ibuprofen (ADVIL) 600 MG tablet Take 1 tablet (600 mg total) by mouth every 6 (six) hours as needed.   [DISCONTINUED] predniSONE (STERAPRED UNI-PAK 21 TAB) 10 MG (21) TBPK tablet Take 6 tabs for 2 days, then 5 for 2 days, then 4 for 2 days, then 3 for 2 days, 2 for 2 days, then 1 for 2 days   No facility-administered encounter medications on file as of 12/16/2022.    Past Medical History:  Diagnosis Date   ASTHMA, UNSPECIFIED 05/20/2006   Qualifier: Diagnosis of  By: Bradly Bienenstock      History reviewed. No pertinent surgical history.  History reviewed. No pertinent family history.  Social History   Socioeconomic History   Marital status: Single    Spouse name: Not on file   Number of children: Not on file   Years of education: Not on file   Highest education level: Bachelor's degree (e.g., BA, AB, BS)  Occupational History   Not on file  Tobacco Use   Smoking status: Some Days   Smokeless tobacco: Never  Substance and Sexual Activity   Alcohol use: Yes    Comment: occas   Drug use: Yes    Types: Marijuana   Sexual activity: Not on file  Other Topics Concern   Not on file  Social History Narrative   Not on file   Social Determinants of Health   Financial Resource Strain: High Risk (12/15/2022)   Overall Financial Resource Strain (CARDIA)    Difficulty of Paying Living Expenses: Very hard  Food Insecurity:  Food Insecurity Present (12/15/2022)   Hunger Vital Sign    Worried About Running Out of Food in the Last Year: Sometimes true    Ran Out of Food in the Last Year: Patient declined  Transportation Needs: Unmet Transportation Needs (12/15/2022)   PRAPARE - Transportation    Lack of Transportation (Medical): Yes    Lack of Transportation (Non-Medical): Yes  Physical Activity: Sufficiently Active (12/15/2022)   Exercise Vital Sign    Days of Exercise per Week: 2 days    Minutes of Exercise per Session: 150+ min  Stress: Stress Concern Present (12/15/2022)   [employer] of Occupational Health - Occupational Stress Questionnaire    Feeling of Stress : Very much  Social Connections: Socially Isolated (12/15/2022)   Social Connection and Isolation Panel [NHANES]    Frequency of Communication with Friends and Family: Once a week    Frequency of Social Gatherings with Friends and Family: Once a week    Attends Religious Services: Never    Database administrator or Organizations: No    Attends Engineer, structural: Not on file    Marital Status: Never married  Intimate Partner Violence: Not on file    Review of Systems  Constitutional:  Positive for chills and fever.  Respiratory:  Positive for cough. Negative for shortness of breath.   All other systems reviewed and are negative.       Objective    BP (!) 140/95   Pulse 78   Temp 98.1 F (36.7 C) (Oral)   Resp 16   Ht 5\' 2"  (1.575 m)   Wt 136 lb 3.2 oz (61.8 kg)   SpO2 97%   BMI 24.91 kg/m   Physical Exam Vitals and nursing note reviewed.  Constitutional:      General: She is not in acute distress. Cardiovascular:     Rate and Rhythm: Normal rate and regular rhythm.  Pulmonary:     Effort: Pulmonary effort is normal.     Breath sounds: Normal breath sounds.  Abdominal:     Palpations: Abdomen is soft.     Tenderness: There is no abdominal tenderness.  Neurological:     General: No focal deficit present.      Mental Status: She is alert and oriented to person, place, and time.         Assessment & Plan:   1. COVID Results of in office test pending.   2. Encounter to establish care   Return if symptoms worsen or fail to improve.   Tommie Raymond, MD

## 2022-12-18 LAB — COVID-19, FLU A+B AND RSV
Influenza A, NAA: NOT DETECTED
Influenza B, NAA: NOT DETECTED
RSV, NAA: NOT DETECTED
SARS-CoV-2, NAA: NOT DETECTED

## 2023-06-01 ENCOUNTER — Emergency Department (HOSPITAL_COMMUNITY)
Admission: EM | Admit: 2023-06-01 | Discharge: 2023-06-01 | Discharge status: Against Medical Advice | Attending: Emergency Medicine | Admitting: Emergency Medicine

## 2023-06-01 ENCOUNTER — Other Ambulatory Visit: Payer: Self-pay

## 2023-06-01 ENCOUNTER — Encounter (HOSPITAL_COMMUNITY): Payer: Self-pay

## 2023-06-01 DIAGNOSIS — Y92410 Unspecified street and highway as the place of occurrence of the external cause: Secondary | ICD-10-CM | POA: Insufficient documentation

## 2023-06-01 DIAGNOSIS — M545 Low back pain, unspecified: Secondary | ICD-10-CM | POA: Insufficient documentation

## 2023-06-01 DIAGNOSIS — Z5321 Procedure and treatment not carried out due to patient leaving prior to being seen by health care provider: Secondary | ICD-10-CM | POA: Diagnosis not present

## 2023-06-01 DIAGNOSIS — M25552 Pain in left hip: Secondary | ICD-10-CM | POA: Insufficient documentation

## 2023-06-01 NOTE — ED Notes (Signed)
 Pt states that she will be back in the morning.
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
## 2023-06-02 ENCOUNTER — Other Ambulatory Visit: Payer: Self-pay

## 2023-06-02 ENCOUNTER — Emergency Department (HOSPITAL_COMMUNITY)
Admission: EM | Admit: 2023-06-02 | Discharge: 2023-06-02 | Disposition: A | Discharge status: Home / Self Care | Attending: Emergency Medicine | Admitting: Emergency Medicine

## 2023-06-02 DIAGNOSIS — S0990XA Unspecified injury of head, initial encounter: Secondary | ICD-10-CM | POA: Diagnosis present

## 2023-06-02 DIAGNOSIS — Y92410 Unspecified street and highway as the place of occurrence of the external cause: Secondary | ICD-10-CM | POA: Insufficient documentation

## 2023-06-02 DIAGNOSIS — M545 Low back pain, unspecified: Secondary | ICD-10-CM | POA: Insufficient documentation

## 2023-06-02 DIAGNOSIS — M7918 Myalgia, other site: Secondary | ICD-10-CM

## 2023-06-02 DIAGNOSIS — M542 Cervicalgia: Secondary | ICD-10-CM | POA: Insufficient documentation

## 2023-06-02 DIAGNOSIS — R1030 Lower abdominal pain, unspecified: Secondary | ICD-10-CM | POA: Diagnosis not present

## 2023-06-02 MED ORDER — LIDOCAINE 5 % EX PTCH
1.0000 | MEDICATED_PATCH | CUTANEOUS | 0 refills | Status: DC
Start: 2023-06-02 — End: 2023-07-22

## 2023-06-02 MED ORDER — METAXALONE 800 MG PO TABS: 800.0000 mg | ORAL_TABLET | Freq: Three times a day (TID) | ORAL | 0 refills | Status: DC
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
## 2023-07-22 ENCOUNTER — Ambulatory Visit (INDEPENDENT_AMBULATORY_CARE_PROVIDER_SITE_OTHER): Admitting: Family Medicine

## 2023-07-22 ENCOUNTER — Other Ambulatory Visit (HOSPITAL_COMMUNITY)
Admission: RE | Admit: 2023-07-22 | Discharge: 2023-07-22 | Disposition: A | Discharge status: Home / Self Care | Source: Ambulatory Visit | Attending: Family Medicine | Admitting: Family Medicine

## 2023-07-22 VITALS — BP 123/79 | HR 71 | Temp 98.6°F | Resp 16 | Ht 62.0 in | Wt 123.7 lb

## 2023-07-22 DIAGNOSIS — Z0000 Encounter for general adult medical examination without abnormal findings: Secondary | ICD-10-CM | POA: Diagnosis not present

## 2023-07-22 DIAGNOSIS — Z13220 Encounter for screening for lipoid disorders: Secondary | ICD-10-CM | POA: Diagnosis not present

## 2023-07-22 DIAGNOSIS — Z130 Encounter for screening for diseases of the blood and blood-forming organs and certain disorders involving the immune mechanism: Secondary | ICD-10-CM

## 2023-07-22 DIAGNOSIS — Z1159 Encounter for screening for other viral diseases: Secondary | ICD-10-CM | POA: Diagnosis not present

## 2023-07-22 NOTE — Progress Notes (Unsigned)
 Established Patient Office Visit  Subjective    Patient ID: Leah Townsend, female    DOB: December 15, 1990  Age: 33 y.o. MRN: [id number]  CC:  Chief Complaint  Patient presents with   Annual Exam    HPI Leah Townsend presents for routine annual exam. Patient denies acute complaints.   Outpatient Encounter Medications as of 07/22/2023  Medication Sig   [DISCONTINUED] cyclobenzaprine  (FLEXERIL ) 10 MG tablet Take 1 tablet (10 mg total) by mouth 2 (two) times daily as needed for muscle spasms.   [DISCONTINUED] ibuprofen  (ADVIL ) 600 MG tablet Take 1 tablet (600 mg total) by mouth every 6 (six) hours as needed.   [DISCONTINUED] lidocaine  (LIDODERM ) 5 % Place 1 patch onto the skin daily. Remove & Discard patch within 12 hours or as directed by MD   [DISCONTINUED] metaxalone  (SKELAXIN ) 800 MG tablet Take 1 tablet (800 mg total) by mouth 3 (three) times daily.   No facility-administered encounter medications on file as of 07/22/2023.    Past Medical History:  Diagnosis Date   ASTHMA, UNSPECIFIED 05/20/2006   Qualifier: Diagnosis of  By: Alen Amy      History reviewed. No pertinent surgical history.  History reviewed. No pertinent family history.  Social History   Socioeconomic History   Marital status: Single    Spouse name: Not on file   Number of children: Not on file   Years of education: Not on file   Highest education level: Bachelor's degree (e.g., BA, AB, BS)  Occupational History   Not on file  Tobacco Use   Smoking status: Some Days   Smokeless tobacco: Never  Substance and Sexual Activity   Alcohol use: Yes    Comment: occas   Drug use: Yes    Types: Marijuana   Sexual activity: Not on file  Other Topics Concern   Not on file  Social History Narrative   Not on file   Social Drivers of Health   Financial Resource Strain: High Risk (07/18/2023)   Overall Financial Resource Strain (CARDIA)    Difficulty of Paying Living Expenses: Hard  Food Insecurity:  No Food Insecurity (07/18/2023)   Hunger Vital Sign    Worried About Running Out of Food in the Last Year: Never true    Ran Out of Food in the Last Year: Never true  Transportation Needs: No Transportation Needs (07/18/2023)   PRAPARE - Administrator, Civil Service (Medical): No    Lack of Transportation (Non-Medical): No  Physical Activity: Sufficiently Active (07/18/2023)   Exercise Vital Sign    Days of Exercise per Week: 5 days    Minutes of Exercise per Session: 40 min  Stress: Stress Concern Present (07/18/2023)   [employer] of Occupational Health - Occupational Stress Questionnaire    Feeling of Stress : To some extent  Social Connections: Socially Isolated (07/18/2023)   Social Connection and Isolation Panel [NHANES]    Frequency of Communication with Friends and Family: More than three times a week    Frequency of Social Gatherings with Friends and Family: Twice a week    Attends Religious Services: Never    Database administrator or Organizations: No    Attends Engineer, structural: Not on file    Marital Status: Never married  Intimate Partner Violence: Not At Risk (07/22/2023)   Humiliation, Afraid, Rape, and Kick questionnaire    Fear of Current or Ex-Partner: No    Emotionally Abused: No  Physically Abused: No    Sexually Abused: No    Review of Systems  All other systems reviewed and are negative.       Objective    BP 123/79   Pulse 71   Temp 98.6 F (37 C) (Oral)   Resp 16   Ht 5\' 2"  (1.575 m)   Wt 123 lb 11.2 oz (56.1 kg)   SpO2 97%   BMI 22.63 kg/m   Physical Exam Vitals and nursing note reviewed.  Constitutional:      General: She is not in acute distress. HENT:     Head: Normocephalic and atraumatic.     Right Ear: Tympanic membrane, ear canal and external ear normal.     Left Ear: Tympanic membrane, ear canal and external ear normal.     Nose: Nose normal.     Mouth/Throat:     Mouth: Mucous membranes are  moist.     Pharynx: Oropharynx is clear.  Eyes:     Conjunctiva/sclera: Conjunctivae normal.     Pupils: Pupils are equal, round, and reactive to light.  Neck:     Thyroid: No thyromegaly.  Cardiovascular:     Rate and Rhythm: Normal rate and regular rhythm.     Heart sounds: Normal heart sounds. No murmur heard. Pulmonary:     Effort: Pulmonary effort is normal. No respiratory distress.     Breath sounds: Normal breath sounds.  Abdominal:     General: There is no distension.     Palpations: Abdomen is soft. There is no mass.     Tenderness: There is no abdominal tenderness.  Musculoskeletal:        General: Normal range of motion.     Cervical back: Normal range of motion and neck supple.  Skin:    General: Skin is warm and dry.  Neurological:     General: No focal deficit present.     Mental Status: She is alert and oriented to person, place, and time.  Psychiatric:        Mood and Affect: Mood normal.        Behavior: Behavior normal.         Assessment & Plan:   Annual physical exam -     CMP14+EGFR -     Cytology - PAP -     Cervicovaginal ancillary only  Screening for deficiency anemia -     CBC with Differential/Platelet  Screening for lipid disorders -     Lipid panel  Need for hepatitis C screening test -     Hepatitis C antibody  Other orders -     HPV 9-valent vaccine,Recombinat     Return in about 1 year (around 07/21/2024) for physical.   Arlo Lama, MD

## 2023-07-22 NOTE — Progress Notes (Unsigned)
 -  Patient is here to have annually  complete physical examination  -Care gap address -labs taken

## 2023-07-23 LAB — CERVICOVAGINAL ANCILLARY ONLY
Bacterial Vaginitis (gardnerella): NEGATIVE
Candida Glabrata: NEGATIVE
Candida Vaginitis: NEGATIVE
Chlamydia: NEGATIVE
Comment: NEGATIVE
Comment: NEGATIVE
Comment: NEGATIVE
Comment: NEGATIVE
Comment: NEGATIVE
Comment: NORMAL
Neisseria Gonorrhea: NEGATIVE
Trichomonas: NEGATIVE

## 2023-07-23 LAB — CBC WITH DIFFERENTIAL/PLATELET
Basophils Absolute: 0 10*3/uL (ref 0.0–0.2)
Basos: 0 %
EOS (ABSOLUTE): 0.1 10*3/uL (ref 0.0–0.4)
Eos: 1 %
Hematocrit: 44.7 % (ref 34.0–46.6)
Hemoglobin: 14.4 g/dL (ref 11.1–15.9)
Immature Grans (Abs): 0 10*3/uL (ref 0.0–0.1)
Immature Granulocytes: 0 %
Lymphocytes Absolute: 2.5 10*3/uL (ref 0.7–3.1)
Lymphs: 31 %
MCH: 30.7 pg (ref 26.6–33.0)
MCHC: 32.2 g/dL (ref 31.5–35.7)
MCV: 95 fL (ref 79–97)
Monocytes Absolute: 0.4 10*3/uL (ref 0.1–0.9)
Monocytes: 5 %
Neutrophils Absolute: 5 10*3/uL (ref 1.4–7.0)
Neutrophils: 63 %
Platelets: 220 10*3/uL (ref 150–450)
RBC: 4.69 x10E6/uL (ref 3.77–5.28)
RDW: 12.8 % (ref 11.7–15.4)
WBC: 8 10*3/uL (ref 3.4–10.8)

## 2023-07-23 LAB — CMP14+EGFR
ALT: 12 IU/L (ref 0–32)
AST: 19 IU/L (ref 0–40)
Albumin: 4.6 g/dL (ref 3.9–4.9)
Alkaline Phosphatase: 69 IU/L (ref 44–121)
BUN/Creatinine Ratio: 19 (ref 9–23)
BUN: 15 mg/dL (ref 6–20)
Bilirubin Total: 0.5 mg/dL (ref 0.0–1.2)
CO2: 19 mmol/L — ABNORMAL LOW (ref 20–29)
Calcium: 9.8 mg/dL (ref 8.7–10.2)
Chloride: 102 mmol/L (ref 96–106)
Creatinine, Ser: 0.81 mg/dL (ref 0.57–1.00)
Globulin, Total: 2.4 g/dL (ref 1.5–4.5)
Glucose: 89 mg/dL (ref 70–99)
Potassium: 4.3 mmol/L (ref 3.5–5.2)
Sodium: 142 mmol/L (ref 134–144)
Total Protein: 7 g/dL (ref 6.0–8.5)
eGFR: 99 mL/min/{1.73_m2} (ref >59)

## 2023-07-23 LAB — LIPID PANEL
Chol/HDL Ratio: 2.5 ratio (ref 0.0–4.4)
Cholesterol, Total: 147 mg/dL (ref 100–199)
HDL: 59 mg/dL (ref >39)
LDL Chol Calc (NIH): 76 mg/dL (ref 0–99)
Triglycerides: 60 mg/dL (ref 0–149)
VLDL Cholesterol Cal: 12 mg/dL (ref 5–40)

## 2023-07-23 LAB — HEPATITIS C ANTIBODY: Hep C Virus Ab: NONREACTIVE

## 2023-07-26 ENCOUNTER — Telehealth: Payer: Self-pay | Admitting: *Deleted

## 2023-07-26 ENCOUNTER — Encounter: Payer: Self-pay | Admitting: Family Medicine

## 2023-07-26 ENCOUNTER — Other Ambulatory Visit

## 2023-07-26 DIAGNOSIS — Z113 Encounter for screening for infections with a predominantly sexual mode of transmission: Secondary | ICD-10-CM

## 2023-07-26 LAB — CYTOLOGY - PAP: Diagnosis: NEGATIVE

## 2023-07-26 NOTE — Telephone Encounter (Signed)
 Attempt to call pateint. No answer, unable to leave voicemeail.

## 2023-07-26 NOTE — Telephone Encounter (Signed)
 Copied from CRM [id number]. Topic: General - Other >> Jul 26, 2023  1:25 PM Felizardo Hotter wrote: Reason for CRM: Pt would like a call back regarding her lab work. Please call pt at [phone].

## 2023-07-27 LAB — RPR: RPR Ser Ql: NONREACTIVE

## 2023-07-27 LAB — HIV ANTIBODY (ROUTINE TESTING W REFLEX): HIV Screen 4th Generation wRfx: NONREACTIVE

## 2023-07-27 NOTE — Telephone Encounter (Signed)
 Patient was called yesterday and came in for lab work   During the call it was explained why the HIV/syphilis was not drawn, I apologized on our behalf.

## 2023-08-09 ENCOUNTER — Encounter: Admitting: Family Medicine

## 2023-08-19 ENCOUNTER — Ambulatory Visit: Payer: Self-pay | Admitting: Family Medicine

## 2023-10-26 ENCOUNTER — Other Ambulatory Visit (HOSPITAL_COMMUNITY)
Admission: RE | Admit: 2023-10-26 | Discharge: 2023-10-26 | Disposition: A | Discharge status: Home / Self Care | Source: Ambulatory Visit | Attending: Family Medicine | Admitting: Family Medicine

## 2023-10-26 ENCOUNTER — Encounter: Payer: Self-pay | Admitting: Family Medicine

## 2023-10-26 ENCOUNTER — Ambulatory Visit (INDEPENDENT_AMBULATORY_CARE_PROVIDER_SITE_OTHER): Admitting: Family Medicine

## 2023-10-26 VITALS — BP 138/86 | HR 69 | Ht 62.0 in | Wt 131.8 lb

## 2023-10-26 DIAGNOSIS — Z113 Encounter for screening for infections with a predominantly sexual mode of transmission: Secondary | ICD-10-CM | POA: Insufficient documentation

## 2023-10-26 NOTE — Progress Notes (Unsigned)
 hepatitis  Established Patient Office Visit  Subjective    Patient ID: Leah Townsend, female    DOB: 26-Jun-1990  Age: 33 y.o. MRN: [id number]  CC:  Chief Complaint  Patient presents with   Medical Management of Chronic Issues    Pt wants std testing     HPI Leah Townsend presents for std screen. Patient denies symptoms but has had unprotected intercourse.   No outpatient encounter medications on file as of 10/26/2023.   No facility-administered encounter medications on file as of 10/26/2023.    Past Medical History:  Diagnosis Date   ASTHMA, UNSPECIFIED 05/20/2006   Qualifier: Diagnosis of  By: Geralene Service      History reviewed. No pertinent surgical history.  History reviewed. No pertinent family history.  Social History   Socioeconomic History   Marital status: Single    Spouse name: Not on file   Number of children: Not on file   Years of education: Not on file   Highest education level: Bachelor's degree (e.g., BA, AB, BS)  Occupational History   Not on file  Tobacco Use   Smoking status: Some Days   Smokeless tobacco: Never  Substance and Sexual Activity   Alcohol use: Yes    Comment: occas   Drug use: Yes    Types: Marijuana   Sexual activity: Not on file  Other Topics Concern   Not on file  Social History Narrative   Not on file   Social Drivers of Health   Financial Resource Strain: Medium Risk (10/23/2023)   Overall Financial Resource Strain (CARDIA)    Difficulty of Paying Living Expenses: Somewhat hard  Food Insecurity: No Food Insecurity (10/23/2023)   Hunger Vital Sign    Worried About Running Out of Food in the Last Year: Never true    Ran Out of Food in the Last Year: Never true  Transportation Needs: No Transportation Needs (10/23/2023)   PRAPARE - Administrator, Civil Service (Medical): No    Lack of Transportation (Non-Medical): No  Physical Activity: Sufficiently Active (10/23/2023)   Exercise Vital Sign    Days of  Exercise per Week: 2 days    Minutes of Exercise per Session: 120 min  Stress: Patient Declined (10/23/2023)   [employer] of Occupational Health - Occupational Stress Questionnaire    Feeling of Stress: Patient declined  Social Connections: Unknown (10/23/2023)   Social Connection and Isolation Panel    Frequency of Communication with Friends and Family: Patient declined    Frequency of Social Gatherings with Friends and Family: Patient declined    Attends Religious Services: Patient declined    Database administrator or Organizations: Patient declined    Attends Banker Meetings: Not on file    Marital Status: Never married  Intimate Partner Violence: Not At Risk (07/22/2023)   Humiliation, Afraid, Rape, and Kick questionnaire    Fear of Current or Ex-Partner: No    Emotionally Abused: No    Physically Abused: No    Sexually Abused: No    Review of Systems  All other systems reviewed and are negative.       Objective    BP 138/86   Pulse 69   Ht 5' 2 (1.575 m)   Wt 131 lb 12.8 oz (59.8 kg)   LMP 10/04/2023   SpO2 97%   BMI 24.11 kg/m   Physical Exam Vitals and nursing note reviewed.  Constitutional:  General: She is not in acute distress. Cardiovascular:     Rate and Rhythm: Normal rate and regular rhythm.  Pulmonary:     Effort: Pulmonary effort is normal.     Breath sounds: Normal breath sounds.  Abdominal:     Palpations: Abdomen is soft.     Tenderness: There is no abdominal tenderness.  Neurological:     General: No focal deficit present.     Mental Status: She is alert and oriented to person, place, and time.         Assessment & Plan:   Screening for STD (sexually transmitted disease) -     Cervicovaginal ancillary only -     HIV Antibody (routine testing w rflx)     Return if symptoms worsen or fail to improve.   Leah Raguel SQUIBB, MD

## 2023-10-27 ENCOUNTER — Ambulatory Visit: Payer: Self-pay | Admitting: Family Medicine

## 2023-10-27 LAB — CERVICOVAGINAL ANCILLARY ONLY
Bacterial Vaginitis (gardnerella): NEGATIVE
Candida Glabrata: NEGATIVE
Candida Vaginitis: POSITIVE — AB
Chlamydia: NEGATIVE
Comment: NEGATIVE
Comment: NEGATIVE
Comment: NEGATIVE
Comment: NEGATIVE
Comment: NEGATIVE
Comment: NORMAL
Neisseria Gonorrhea: NEGATIVE
Trichomonas: NEGATIVE

## 2023-10-27 LAB — HIV ANTIBODY (ROUTINE TESTING W REFLEX): HIV Screen 4th Generation wRfx: NONREACTIVE

## 2023-10-27 MED ORDER — FLUCONAZOLE 150 MG PO TABS: 150.0000 mg | ORAL_TABLET | Freq: Once | ORAL | 0 refills | Status: AC

## 2023-10-28 ENCOUNTER — Encounter: Payer: Self-pay | Admitting: Family Medicine

## 2023-10-28 ENCOUNTER — Telehealth: Payer: Self-pay

## 2023-10-28 NOTE — Telephone Encounter (Signed)
 Patient states she would like herpes and syphillis testing. Please advise if you will order those   Copied from CRM [id number]. Topic: General - Other >> Oct 28, 2023 10:36 AM Revonda D wrote: Reason for CRM: Pt is requesting to speak with Dr.Wilson or her nurse in regards to the STD testing. Pt stated that every time she does the testing, two tests were left off for testing. Pt stated that the herpes and syphilis testing were not done and she would like to have those completed. Pt would like a callback today in regards to this concern.

## 2023-11-17 ENCOUNTER — Other Ambulatory Visit: Payer: Self-pay | Admitting: Family Medicine

## 2023-11-17 MED ORDER — FLUCONAZOLE 150 MG PO TABS: 150.0000 mg | ORAL_TABLET | Freq: Once | ORAL | 0 refills | Status: AC

## 2023-11-18 ENCOUNTER — Other Ambulatory Visit: Payer: Self-pay | Admitting: Medical Genetics

## 2023-11-30 ENCOUNTER — Other Ambulatory Visit

## 2023-12-14 ENCOUNTER — Other Ambulatory Visit

## 2023-12-30 ENCOUNTER — Other Ambulatory Visit

## 2023-12-30 DIAGNOSIS — Z006 Encounter for examination for normal comparison and control in clinical research program: Secondary | ICD-10-CM

## 2024-01-08 LAB — GENECONNECT MOLECULAR SCREEN: Genetic Analysis Overall Interpretation: NEGATIVE
